# Patient Record
Sex: Male | Born: 1990 | Race: Black or African American | Hispanic: No | Marital: Married | Smoking: Never smoker
Health system: Southern US, Community
[De-identification: ages and names within clinical notes are randomized; demographics above are authoritative.]

---

## 2021-11-08 NOTE — Progress Notes (Addendum)
Client presents for Refugee Communicable Disease Screening.   The consent for treatment was explained to the client.  The plan for the visit was explained to the client.  The communicable disease assessment was explained to the client. Consent signed by client.    Alien Number: [phone] Status: Refugee Class A/B Condition: None  Language Spoke: Kiswahili  Interpreter Name and Fraser Din Number:  Tinnie Gens [id number]   What is your Country of Origin? [place]   Arrived in [place] approx 2003 and lived their for approx 20 years.   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  Yes Any Allergies to any Medications or Foods? No Do you have any Medical Conditions or Concerns today? - To be referred to [facility]. Abdominal pain for several months, since arrival nausea, diarrhea, bloating, decreased appetite Low back pain Pain in inguinal area x2 years Pain in left nostril   Communicable Disease Screening Questions: Do you have any rashes on your body?No      Are you currently experiencing any abdominal pain, vomiting, or diarrhea?  Yes  Have you ever in your lifetime been sexually active?  Yes Males Only:  Do you currently have any new penile discharge, burning or itching?  No  See TB Screening Flow sheet  The following laboratory screening tests offered and accepted by client today:   RPR, HIV,  Hep C, TB screening with QuantiFERON Gold, Hemoglobin Electrophoresis,  Varicella Titer  Did the client decline any of the laboratory tests that were offered? No Hep B testing not needed; previous testing HBsAg 01/04//2023 Negative, will complete Hep B vaccine  series.  Immunization records reviewed and Immunization Screening completed  Where any vaccines needed and/or administered?  Yes; Hep B, Tdap and Flu VIS provided, vaccines administered, copy of NCIR provided, Tolerated well, After vaccine care reviewed.  Reviewed dates for next vaccines.   Will refer to [facility] for general primary care.  CWS case worker to schedule appt.  Ap with [facility] at [facility] 12/14/2021

## 2021-11-10 ENCOUNTER — Ambulatory Visit (LOCAL_COMMUNITY_HEALTH_CENTER): Payer: Medicaid Other

## 2021-11-10 VITALS — BP 113/83 | HR 62 | Ht 65.0 in | Wt 127.5 lb

## 2021-11-10 DIAGNOSIS — Z23 Encounter for immunization: Secondary | ICD-10-CM

## 2021-11-10 DIAGNOSIS — Z111 Encounter for screening for respiratory tuberculosis: Secondary | ICD-10-CM

## 2021-11-10 DIAGNOSIS — Z0184 Encounter for antibody response examination: Secondary | ICD-10-CM

## 2021-11-10 DIAGNOSIS — R7612 Nonspecific reaction to cell mediated immunity measurement of gamma interferon antigen response without active tuberculosis: Secondary | ICD-10-CM | POA: Insufficient documentation

## 2021-11-10 DIAGNOSIS — Z130 Encounter for screening for diseases of the blood and blood-forming organs and certain disorders involving the immune mechanism: Secondary | ICD-10-CM

## 2021-11-10 DIAGNOSIS — Z113 Encounter for screening for infections with a predominantly sexual mode of transmission: Secondary | ICD-10-CM

## 2021-11-10 LAB — HM HIV SCREENING LAB: HM HIV Screening: NEGATIVE

## 2021-11-10 LAB — HM HEPATITIS C SCREENING LAB: HM Hepatitis Screen: NEGATIVE

## 2021-11-14 ENCOUNTER — Other Ambulatory Visit: Payer: Self-pay

## 2021-11-15 LAB — QUANTIFERON-TB GOLD PLUS
QuantiFERON Mitogen Value: >10 IU/mL
QuantiFERON Nil Value: 0.36 IU/mL
QuantiFERON TB1 Ag Value: 9.26 IU/mL
QuantiFERON TB2 Ag Value: >10 IU/mL
QuantiFERON-TB Gold Plus: POSITIVE — AB

## 2021-11-15 LAB — VARICELLA ZOSTER ANTIBODY, IGG: Varicella zoster IgG: 1058 index (ref >165)

## 2021-11-16 ENCOUNTER — Telehealth: Payer: Self-pay

## 2021-11-16 ENCOUNTER — Ambulatory Visit
Admission: RE | Admit: 2021-11-16 | Discharge: 2021-11-16 | Disposition: A | Discharge status: Home / Self Care | Payer: Medicaid Other | Attending: Family Medicine | Admitting: Family Medicine

## 2021-11-16 ENCOUNTER — Other Ambulatory Visit: Payer: Self-pay | Admitting: Surgery

## 2021-11-16 ENCOUNTER — Ambulatory Visit
Admission: RE | Admit: 2021-11-16 | Discharge: 2021-11-16 | Disposition: A | Discharge status: Home / Self Care | Payer: Medicaid Other | Source: Ambulatory Visit | Attending: Family Medicine | Admitting: Family Medicine

## 2021-11-16 ENCOUNTER — Other Ambulatory Visit (LOCAL_COMMUNITY_HEALTH_CENTER): Payer: Medicaid Other

## 2021-11-16 DIAGNOSIS — R7612 Nonspecific reaction to cell mediated immunity measurement of gamma interferon antigen response without active tuberculosis: Secondary | ICD-10-CM | POA: Insufficient documentation

## 2021-11-16 NOTE — Addendum Note (Signed)
Encounter addended by: Vanessa Ralphs on: 11/16/2021 12:33 PM ? Actions taken: Imaging Exam ended

## 2021-11-16 NOTE — Telephone Encounter (Signed)
Spoke with patient via language line [phone]) regarding positive QFT and need for CXR. No symptoms.   ?

## 2021-11-16 NOTE — Addendum Note (Signed)
Encounter addended by: Vanessa Ralphs on: 11/16/2021 12:32 PM ? Actions taken: Imaging Exam begun

## 2021-11-22 NOTE — Addendum Note (Signed)
Addended by: Jennye Moccasin on: 11/22/2021 04:34 PM ? ? Modules accepted: Orders ? ?

## 2021-11-22 NOTE — Progress Notes (Signed)
CXR duplicate, canceling order. ? ?Jennye Moccasin, MD  ?

## 2021-11-23 ENCOUNTER — Ambulatory Visit (LOCAL_COMMUNITY_HEALTH_CENTER): Payer: Medicaid Other | Admitting: Surgery

## 2021-11-23 ENCOUNTER — Other Ambulatory Visit: Payer: Self-pay | Admitting: Surgery

## 2021-11-23 VITALS — Wt 127.0 lb

## 2021-11-23 DIAGNOSIS — R7612 Nonspecific reaction to cell mediated immunity measurement of gamma interferon antigen response without active tuberculosis: Secondary | ICD-10-CM

## 2021-11-23 NOTE — Progress Notes (Signed)
Spoke to patient on phone with [place] interpreter. ? ?EPI updated with missing info. ? ?Explained active versus latent TB, and informed patient of having a negative CXR and no evidence of active TB disease. Explained to the patient that he/she is not sick nor contagious with TB. ? ?Offered treatment for latent TB to ensure patient does not become sick in the future with active TB.  ? ?Explained it would be a 4 month course taking two capsules of an antibiotic, Rifampin, every day.  ? ?Explained the medication, office visits and any necessary imaging or labs would be of no cost to the patient.  ? ?The patient would like to proceed with treatment for latent TB. ? ?Scheduled appointment for Wednesday, May 3rd at 9:15am. ? ?Leigh Aurora, MD  ?

## 2021-11-23 NOTE — Progress Notes (Signed)
+  QFT: 11/10/2021 ?CXR: negative 11/16/2021 ?EPI:11/14/2021 ? ?HIV: neg 11/10/2021 ?Syphilis: neg 11/10/2021 ? ?Tuberculosis treatment orders  ?All patients are to be monitored per [place] and county TB policies.   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 Treat for latent TB per the following: ? ?Rifampin 600mg  daily by mouth x 4 months per Dr. .  ? ?No monthly or baseline labs currently indicated. Only needs labs if concerning symptoms arise or new medications reported that would require monitoring. ? ?Alvester Morin, MD  ?

## 2021-11-25 ENCOUNTER — Telehealth: Payer: Self-pay | Admitting: Surgery

## 2021-11-25 DIAGNOSIS — R7612 Nonspecific reaction to cell mediated immunity measurement of gamma interferon antigen response without active tuberculosis: Secondary | ICD-10-CM

## 2021-11-25 NOTE — Telephone Encounter (Signed)
Swahili interpreter, confirmed that pt spoke to his case worker Jonny Ruiz B, and he will help arrange Benedetto Goad to appt next Weds. Both he and his father are coming to appt. Will remind by text on Tues about appt. ? ?Jennye Moccasin, MD  ?

## 2021-11-30 ENCOUNTER — Ambulatory Visit (LOCAL_COMMUNITY_HEALTH_CENTER): Payer: Medicaid Other | Admitting: Surgery

## 2021-11-30 VITALS — Wt 134.5 lb

## 2021-11-30 DIAGNOSIS — R7612 Nonspecific reaction to cell mediated immunity measurement of gamma interferon antigen response without active tuberculosis: Secondary | ICD-10-CM | POA: Diagnosis not present

## 2021-11-30 MED ORDER — RIFAMPIN 300 MG PO CAPS: 600.0000 mg | ORAL_CAPSULE | Freq: Every day | ORAL | 0 refills | Status: AC

## 2021-11-30 NOTE — Progress Notes (Signed)
Interpreter: Language line, Swahili ? 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 He is here with his father who is also + for latent TB. ? ?Patient wants to start tx for LTBI with Rifampin 600mg  daily for 4 months. Patient has signed agreements to treat and obtain labs if necessary. ? ?The patient does have several constitutional complaints (not eating well, fatigue, stomach pain) and when asked why he is not eating he sounds slightly depressed and reports they are probably having trouble getting food they like and getting around. He misses playing soccer, most soccer clubs are too far away and they do not have transportation. He reports his family often just "sits around the home and feels tired." Encouraged patient that they have been through a huge change in their lives and things will get easier as time goes on.  ? ?Dispensed Rifampin 300mg , #60 at today's visit.  ? ?Potential side effects discussed, patient information sheet given along with contact number. Patient advised to refrain from drinking alcohol during treatment and if sexually active, to use additional birth control (condoms) since rifampin can reduce the efficacy of contraception.  ? ?If any concerning side effects arise patient instructed to stop medication and contact TB staff immediately.  ? ?The patient's next visit is scheduled for 12/28/2021 at 9:00am. ? ?The patient needs a taxi scheduled for transportation for their next visit to and from the health department.  ? ? , MD  ?

## 2021-12-28 ENCOUNTER — Ambulatory Visit (LOCAL_COMMUNITY_HEALTH_CENTER): Payer: Medicaid Other | Admitting: Surgery

## 2021-12-28 ENCOUNTER — Telehealth: Payer: Self-pay | Admitting: Surgery

## 2021-12-28 VITALS — Wt 137.0 lb

## 2021-12-28 DIAGNOSIS — R7612 Nonspecific reaction to cell mediated immunity measurement of gamma interferon antigen response without active tuberculosis: Secondary | ICD-10-CM

## 2021-12-28 MED ORDER — RIFAMPIN 300 MG PO CAPS: 600.0000 mg | ORAL_CAPSULE | Freq: Every day | ORAL | 0 refills | Status: AC

## 2021-12-28 NOTE — Telephone Encounter (Signed)
Spoke to patient about going to [facility] next week with his father to establish care.   Spoke w/Caroline at [facility] [phone], trying for 1pm appt on June 6th for patient to meet with a volunteer and via video interpreter (Swahili) to complete their applications. Also made aware that patients do not have transportation, [facility] to arrange taxi for pts.  Faxed patient info to [facility] at [phone], as far as names, addresses and phone number so they can contact them and confirm their transportation and appts to complete application.   Leigh Aurora, MD

## 2021-12-28 NOTE — Progress Notes (Unsigned)
Swahili__  language; used language line   Patient has been taking Rifampin 600 mg daily for 1 month for LTBI treatment.  Patient is doing well on current medication regimen. No n/v/f/c, eating normally, no concerning weight loss.   Patient does reports some "chest pain" that he later clarifies is chest tightness and a feeling of panic. He brings up the term panic attack on his own, and reports he feels like he has difficulty breathing. He has reported feeling depressed in the past and he has just started a new job at [employer]. He has these feelings sometimes at work, after work, and when taking his medicine. Reassured him that I did not think it was the medicine and that having anxiety and stress would be common for someone in his situation who has had so many huge life changes and is now living in a new country, not knowing the language and having no easy mode of transportation for work or medical needs.   Gave patient information about [facility] which can provide medical and mental health services. Gave patient and his father paper applications and told patient I will call [facility] to see if I can assist in arranging first meeting with staff and an interpreter so he can complete the application and establish care.   Patient reports taking Rifampin daily as prescribed.  Patient has about 6 or 8 pills left. Patient advised to finish the remaining pills in their old bottle, then start on the next bottle of medication.  Dispensed #2 month of Rifampin for LTBI tx. Dispensed #60 300 mg capsules.   I provided counseling today regarding the medication, we discussed the medication, the side effects and when to call clinic. Patient given the opportunity to ask questions.   Patient advised to contact [facility]/TB control phone for any concerning symptoms or questions.  Patient's next visit has been scheduled for:  June 29th at 9:30am.  Contact patient 1 week ahead of time to confirm appointment  and reschedule if necessary.  Will need taxi scheduled for transportation.  Jennye Moccasin, MD

## 2022-01-11 ENCOUNTER — Ambulatory Visit: Payer: Self-pay | Admitting: Gerontology

## 2022-01-26 ENCOUNTER — Ambulatory Visit (LOCAL_COMMUNITY_HEALTH_CENTER): Payer: Medicaid Other | Admitting: Surgery

## 2022-01-26 VITALS — Wt 133.5 lb

## 2022-01-26 DIAGNOSIS — Z719 Counseling, unspecified: Secondary | ICD-10-CM

## 2022-01-26 DIAGNOSIS — R7612 Nonspecific reaction to cell mediated immunity measurement of gamma interferon antigen response without active tuberculosis: Secondary | ICD-10-CM

## 2022-01-26 DIAGNOSIS — Z23 Encounter for immunization: Secondary | ICD-10-CM

## 2022-01-26 MED ORDER — RIFAMPIN 300 MG PO CAPS: 600.0000 mg | ORAL_CAPSULE | Freq: Every day | ORAL | 0 refills | Status: AC

## 2022-01-26 NOTE — Progress Notes (Signed)
Patient seen in Nurse clinic for Tb and Immunization apt.  VIS information sheets given to him for Hep A and Hep B in [employer].  No questions.  Vaccines administered and tolerated well.  After vaccine care reviewed.  Also discussed HPV  vaccine and gave information in Swahili.  Patient will return in Oct for next vaccine series, and will determine at the apt if HPV will be given.  Copy of NCIR provided.  Kalesha Irving Sherrilyn Rist, RN

## 2022-01-26 NOTE — Progress Notes (Signed)
Swahili language; used language line  Patient has been taking Rifampin 600 mg daily for 2 months for LTBI treatment.  Patient is doing well on current medication regimen. No n/v/f/c, eating normally, no concerning weight loss. Patient reports taking medications daily as prescribed as aside from few days missed.  Patient reports that feelings of panic/chest tightness are better, still has feeling of chest tightness but mainly when very hungry, occasionally after taking RIF pills, not so much at work any more. Also reports some muscle/leg pain but appears to be after standing long hours (he mentioned 6 hours) or playing soccer, not likely related to Rifampin. Discussed again establishing care with [facility] for primary care needs, in fact he has been seen to establish PCP but seems to think they will call him later for a future appt. Will clarify with staff and let him know.  Patient not sure how many pills left, forgot a few days but otherwise taking regularly. Patient advised to finish the remaining pills in their old bottle, then start on the next bottle of medication.  Dispensed #3 month of Rifampin for LTBI tx. Dispensed #60, 300 mg capsules.   I provided counseling today regarding the medication, we discussed the medication, the side effects and when to call clinic. Patient given the opportunity to ask questions.   Patient advised to contact [facility]/TB control phone for any concerning symptoms or questions.  Patient's next visit has been scheduled for: Thursday July 27th at 10:100am  Contact patient 1 week ahead of time to confirm appointment and reschedule if necessary.  Will need taxi scheduled for transportation.  Jennye Moccasin, MD

## 2022-02-24 ENCOUNTER — Ambulatory Visit (LOCAL_COMMUNITY_HEALTH_CENTER): Payer: Medicaid Other | Admitting: Surgery

## 2022-02-24 VITALS — Wt 131.0 lb

## 2022-02-24 DIAGNOSIS — R7612 Nonspecific reaction to cell mediated immunity measurement of gamma interferon antigen response without active tuberculosis: Secondary | ICD-10-CM

## 2022-02-24 MED ORDER — RIFAMPIN 300 MG PO CAPS: 600.0000 mg | ORAL_CAPSULE | Freq: Every day | ORAL | 0 refills | Status: AC

## 2022-02-24 NOTE — Progress Notes (Signed)
Language: Swahili; Language line    Patient has been taking Rifampin 600 mg daily for 3 months for LTBI treatment.  Patient is doing well on current medication regimen. No n/v/f/c, eating normally, no concerning weight loss. Patient has occasional neck pain, but reports he has had this since he lived back "at home" in [place]. Patient reports taking medications daily as prescribed, patient has about 8 more days of pills. Patient was advised to complete all of these pills before starting the next bottle.  Dispensed #4 and final month of Rifampin for LTBI tx. Dispensed #60, 300 mg capsules.   Explained to patient that will always be positive on PPD skin test and blood test for exposure to TB. If patient is asked by employer, school, or other institution to take a TB test, patient should supply proof of treatment completion and/or obtain a TB Screening at the health department or at patient's PCP. This information was also provided in writing in the patient's completion letter which was given to the patient today along with a TB treatment completion card.   All completion letters and completion card were sent for scanning into Epic, along with the patient's TB drug record ([id number]).  Patient was advised to contact [facility]/TB control phone for any concerning symptoms or questions.   Jennye Moccasin, MD

## 2022-06-28 ENCOUNTER — Ambulatory Visit: Payer: Medicaid Other

## 2022-07-06 ENCOUNTER — Emergency Department
Admission: EM | Admit: 2022-07-06 | Discharge: 2022-07-06 | Disposition: A | Discharge status: Home / Self Care | Payer: Medicaid Other | Attending: Emergency Medicine | Admitting: Emergency Medicine

## 2022-07-06 ENCOUNTER — Other Ambulatory Visit: Payer: Self-pay

## 2022-07-06 ENCOUNTER — Emergency Department: Payer: Medicaid Other

## 2022-07-06 DIAGNOSIS — K5900 Constipation, unspecified: Secondary | ICD-10-CM | POA: Insufficient documentation

## 2022-07-06 DIAGNOSIS — R109 Unspecified abdominal pain: Secondary | ICD-10-CM | POA: Diagnosis present

## 2022-07-06 LAB — URINALYSIS, ROUTINE W REFLEX MICROSCOPIC
Bilirubin Urine: NEGATIVE
Glucose, UA: NEGATIVE mg/dL
Hgb urine dipstick: NEGATIVE
Ketones, ur: NEGATIVE mg/dL
Leukocytes,Ua: NEGATIVE
Nitrite: NEGATIVE
Protein, ur: NEGATIVE mg/dL
Specific Gravity, Urine: 1.011 (ref 1.005–1.030)
pH: 8 (ref 5.0–8.0)

## 2022-07-06 MED ORDER — IBUPROFEN 600 MG PO TABS: 600.0000 mg | ORAL_TABLET | Freq: Four times a day (QID) | ORAL | 0 refills | Status: DC | PRN

## 2022-07-06 MED ORDER — DOCUSATE SODIUM 100 MG PO CAPS: 100.0000 mg | ORAL_CAPSULE | Freq: Two times a day (BID) | ORAL | 0 refills | Status: AC

## 2022-07-06 NOTE — ED Provider Notes (Signed)
[facility] Provider Note    Event Date/Time   First MD Initiated Contact with Patient 07/06/22 1120     (approximate)   History   Back Pain (RIGHT sided thoracic/lumbar back pain that has been "going on for a long time" and worsens when he stands or walks for an extended period of time; Denies urinary symptoms) and Eye Problem (Bilateral eye "ache", itching, and light sensitivity; Denies any visual changes)   HPI  HPI obtained via video interpreter  Cameron Bradford is a 31 y.o. male with a history of TB who is a recent refugee from [place] and moved to the [place] approximately 8 months ago.  He presents with right-sided abdominal and flank pain which started when he was still in [place] so has been going on for more than 8 months.  It is persistent although intermittent in intensity.  He denies any associated nausea, vomiting, or diarrhea.  He does endorse some constipation and bloating.  He has no urinary symptoms.  He denies any weakness or numbness.  He states that the pain is worse when he is working and using his body and better when he rests.  He states that the symptoms started after he had some snacks at a soccer match back in [place] and wonders if it could be some kind of food poisoning.  He also reports bilateral eye pain which has been intermittent for approximately the same amount of time.  It sometimes radiates towards the back of his head and is not associated with any redness, discharge, or any vision changes.  I reviewed the past medical records.  The patient was evaluated by infectious disease back in April, had a positive QuantiFERON and was treated for TB with rifampin.  At that time she also reported right-sided pain as well as some other GI symptoms.   Physical Exam   Triage Vital Signs: ED Triage Vitals  Enc Vitals Group     BP 07/06/22 1058 125/77     Pulse Rate 07/06/22 1058 75     Resp 07/06/22 1058 16     Temp 07/06/22 1058 98.3 F  (36.8 C)     Temp Source 07/06/22 1058 Oral     SpO2 07/06/22 1058 100 %     Weight --      Height --      Head Circumference --      Peak Flow --      Pain Score 07/06/22 1109 7     Pain Loc --      Pain Edu? --      Excl. in GC? --     Most recent vital signs: Vitals:   07/06/22 1058  BP: 125/77  Pulse: 75  Resp: 16  Temp: 98.3 F (36.8 C)  SpO2: 100%     General: Alert, well-appearing. CV:  Good peripheral perfusion.  Resp:  Normal effort.  Abd:  Soft and nontender.  No distention.  Other:  No CVA tenderness.  No midline or paraspinal back tenderness.  No rashes or lesions.  EOMI.  PERRLA.  Conjunctiva appear normal with no injection or discharge.   ED Results / Procedures / Treatments   Labs (all labs ordered are listed, but only abnormal results are displayed) Labs Reviewed  URINALYSIS, ROUTINE W REFLEX MICROSCOPIC - Abnormal; Notable for the following components:      Result Value   Color, Urine YELLOW (*)    APPearance CLEAR (*)    All  other components within normal limits     EKG     RADIOLOGY  XR abdomen: I independently viewed and interpreted the images; there are no dilated bowel loops or air-fluid levels.  Radiology report indicates nonobstructive pattern with stool in the ascending colon and rectum   PROCEDURES:  Critical Care performed: No  Procedures   MEDICATIONS ORDERED IN ED: Medications - No data to display   IMPRESSION / MDM / [place] / ED COURSE  I reviewed the triage vital signs and the nursing notes.  31 year old male with PMH as noted above presents with subacute to chronic right sided pain with some constipation but no other significant associated symptoms, as well as bilateral eye pain/headaches with no vision changes.  Physical exam is unremarkable.  Differential diagnosis includes, but is not limited to, constipation, IBS, musculoskeletal abdominal wall pain, lumbar radiculopathy, other nerve pain, less  likely kidney stone or other urinary etiology.  We will obtain abdominal plain films, urinalysis, and reassess.  Patient's presentation is most consistent with exacerbation of chronic illness.  ----------------------------------------- 12:55 PM on 07/06/2022 -----------------------------------------  Urinalysis is negative.  X-ray shows a nonobstructive pattern with a moderate amount of stool in the colon especially the ascending colon which corresponds to the location of the patient's pain.  Overall I suspect constipation is likely etiology of the pain.  I have prescribed Colace and ibuprofen.  I instructed him to follow-up with gastroenterology.  The patient is currently applying for Medicaid so should be able to follow-up.  I counseled him on the results of the workup and plan of care via the video interpreter.  I answered all of his questions.  I gave strict return precautions and he expressed understanding.   FINAL CLINICAL IMPRESSION(S) / ED DIAGNOSES   Final diagnoses:  Right flank pain  Constipation, unspecified constipation type     Rx / DC Orders   ED Discharge Orders          Ordered    docusate sodium (COLACE) 100 MG capsule  2 times daily        07/06/22 1254    ibuprofen (ADVIL) 600 MG tablet  Every 6 hours PRN        07/06/22 1254             Note:  This document was prepared using Dragon voice recognition software and may include unintentional dictation errors.    Arta Silence, MD 07/06/22 1256

## 2022-07-06 NOTE — ED Triage Notes (Signed)
RIGHT sided thoracic/lumbar back pain that has been "going on for a long time" and worsens when he stands or walks for an extended period of time; Denies urinary symptoms.  Bilateral eye "ache", itching, and light sensitivity; Denies any visual changes.

## 2022-07-21 ENCOUNTER — Ambulatory Visit (LOCAL_COMMUNITY_HEALTH_CENTER): Payer: Medicaid Other

## 2022-07-21 DIAGNOSIS — Z719 Counseling, unspecified: Secondary | ICD-10-CM

## 2022-07-21 DIAGNOSIS — Z23 Encounter for immunization: Secondary | ICD-10-CM | POA: Diagnosis not present

## 2022-07-21 NOTE — Progress Notes (Signed)
Chief Executive Officer used, Interpreter badge number: [phone] Patient seen for the following immunizations: Tdap, Polio SQ, Flu VIS forms given. NCIR immunization copy given.

## 2022-08-21 ENCOUNTER — Telehealth: Payer: Self-pay

## 2022-08-21 NOTE — Telephone Encounter (Signed)
Client reminded of appt for 08/25/2021 at 9am, call answered and completed.[employer] used, Interpreter badge number: [phone] . Transportation has been set up. Trip ref #: [id number]

## 2022-08-25 ENCOUNTER — Ambulatory Visit (LOCAL_COMMUNITY_HEALTH_CENTER): Payer: Medicaid Other

## 2022-08-25 DIAGNOSIS — Z23 Encounter for immunization: Secondary | ICD-10-CM

## 2022-08-25 NOTE — Progress Notes (Signed)
[place] Language line used, Interpreter agent name and badge number: Beverlee Nims [phone]. Patient seen for the following immunizations: IPV, Hep A VIS forms given. NCIR immunization copy given. Tolerated well.

## 2022-09-27 ENCOUNTER — Ambulatory Visit (LOCAL_COMMUNITY_HEALTH_CENTER): Payer: Medicaid Other

## 2022-09-27 DIAGNOSIS — Z23 Encounter for immunization: Secondary | ICD-10-CM

## 2022-09-27 DIAGNOSIS — Z719 Counseling, unspecified: Secondary | ICD-10-CM

## 2022-09-27 NOTE — Progress Notes (Signed)
[employer] used. Patient seen for the following immunizations:  VIS forms given. NCIR immunization copy given. Comirnaty 23-24'  Are you feeling sick today? No   Have you ever received a dose of COVID-19 Vaccine? [employer], [place], [place], [place], Other) Yes  If yes, which vaccine and how many doses?   Sande Rives 10/14/21, one dose   Did you bring the vaccination record card or other documentation?  No   Do you have a health condition or are undergoing treatment that makes you moderately or severely immunocompromised? This would include, but not be limited to: cancer, HIV, organ transplant, immunosuppressive therapy/high-dose corticosteroids, or moderate/severe primary immunodeficiency.  No  Have you received COVID-19 vaccine before or during hematopoietic cell transplant (HCT) or CAR-T-cell therapies? No  Have you ever had an allergic reaction to: (This would include a severe allergic reaction or a reaction that caused hives, swelling, or respiratory distress, including wheezing.) A component of a COVID-19 vaccine or a previous dose of COVID-19 vaccine? No   Have you ever had an allergic reaction to another vaccine (other thanCOVID-19 vaccine) or an injectable medication? (This would include a severe allergic reaction or a reaction that caused hives, swelling, or respiratory distress, including wheezing.)   No    Do you have a history of any of the following:  Myocarditis or Pericarditis No  Dermal fillers:  No  Multisystem Inflammatory Syndrome (MIS-C or MIS-A)? No  COVID-19 disease within the past 3 months? No  Vaccinated with monkeypox vaccine in the last 4 weeks? No

## 2022-11-13 ENCOUNTER — Ambulatory Visit: Payer: Self-pay

## 2022-11-14 NOTE — Progress Notes (Signed)
Here to get information on the I693 form.

## 2022-12-15 ENCOUNTER — Ambulatory Visit (LOCAL_COMMUNITY_HEALTH_CENTER): Payer: Self-pay

## 2022-12-15 ENCOUNTER — Ambulatory Visit: Payer: Self-pay

## 2022-12-15 DIAGNOSIS — Z719 Counseling, unspecified: Secondary | ICD-10-CM

## 2022-12-15 DIAGNOSIS — Z0289 Encounter for other administrative examinations: Secondary | ICD-10-CM

## 2022-12-15 NOTE — Progress Notes (Signed)
Client here to complete form I-693 form. Copy given to client. Client to follow up with [employer].

## 2023-01-30 IMAGING — CR DG CHEST 1V
1 series · 1 of 1 positions shown · non-contrast
Comparison: No priors.

CLINICAL DATA: 31-year-old male with history of positive
QuantiFERON TB gold test.

EXAM:
CHEST  1 VIEW

[chest pa]
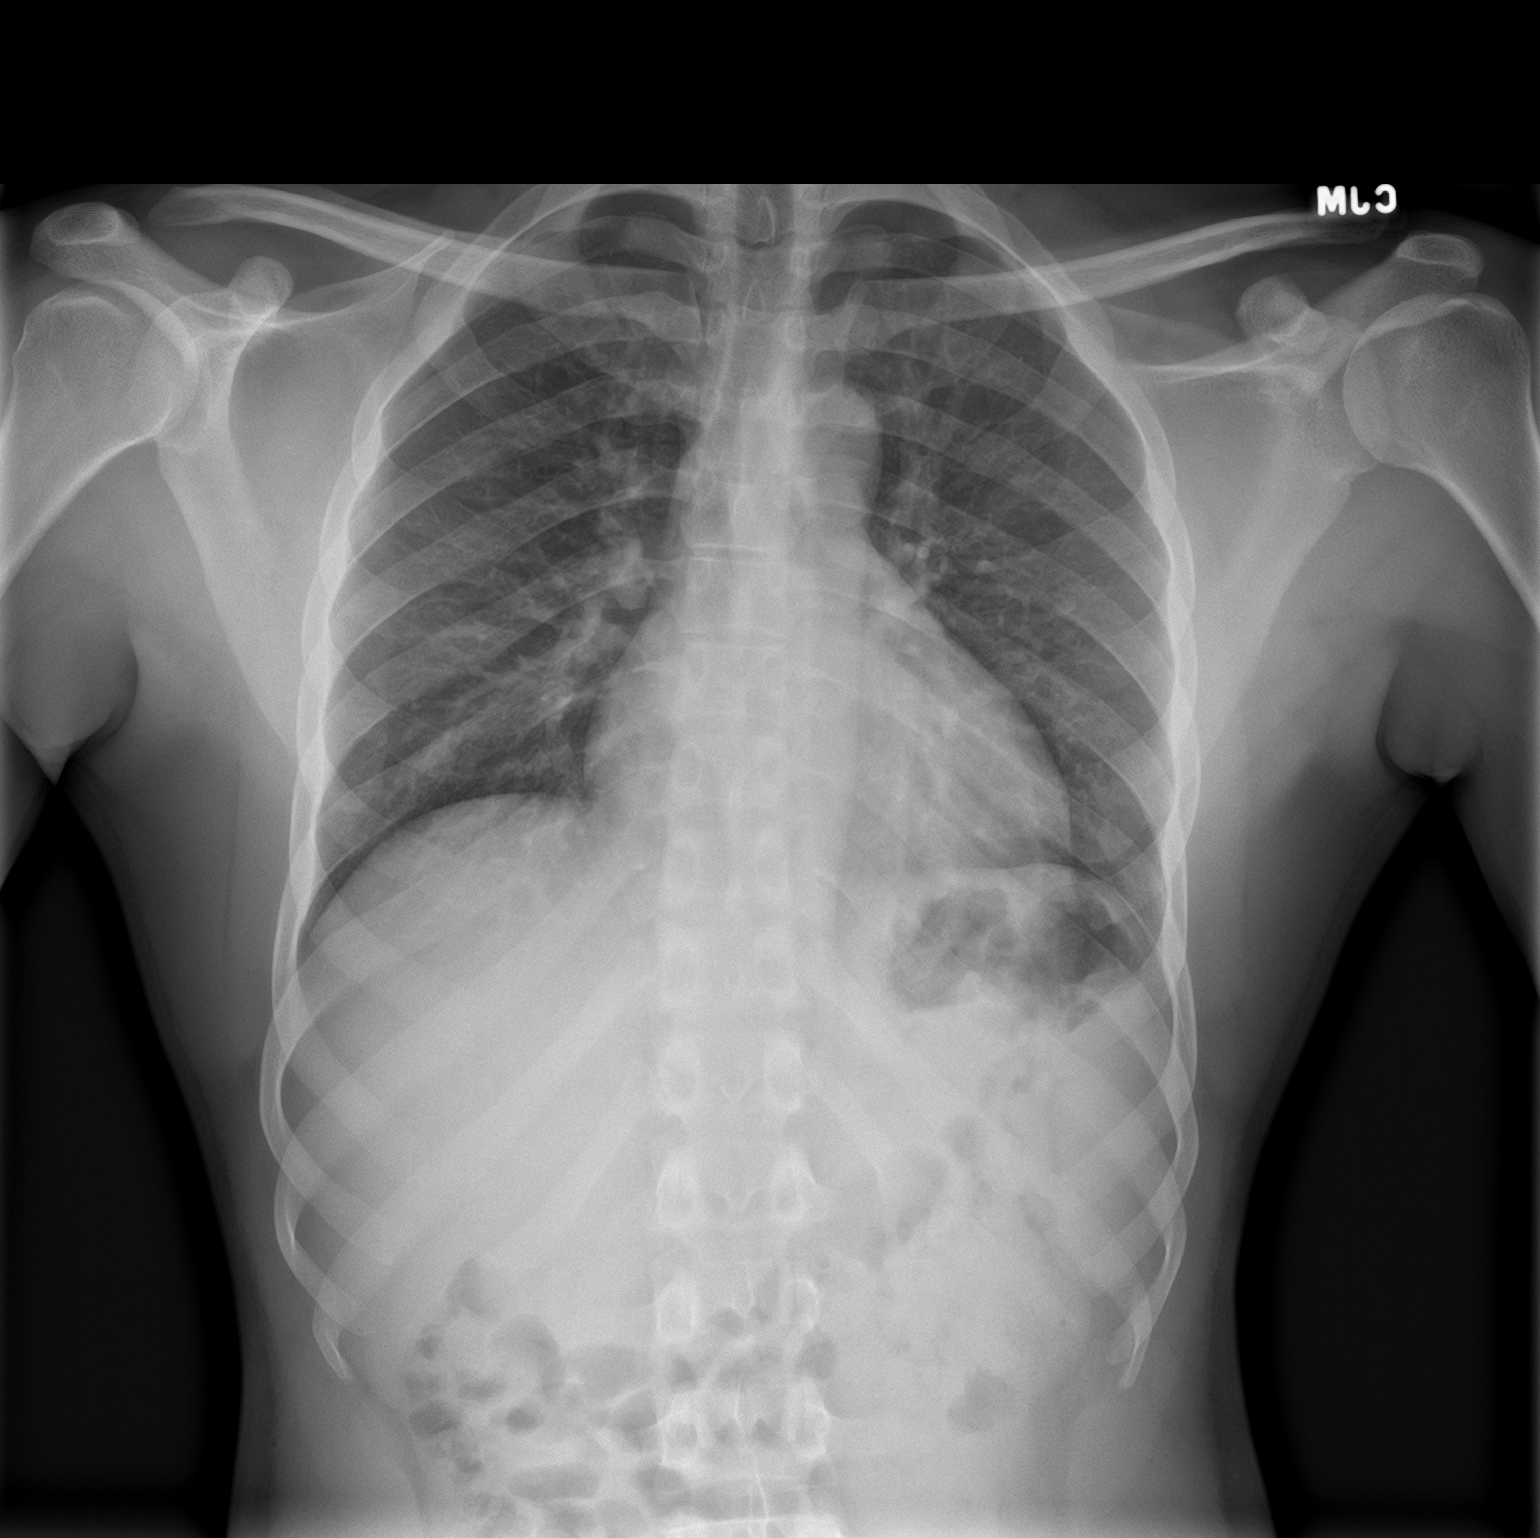

[1 of 1 positions shown; findings below may reference images not displayed]

FINDINGS: Lung volumes are normal. No consolidative airspace disease. No
pleural effusions. No pneumothorax. No pulmonary nodule or mass
noted. Pulmonary vasculature and the cardiomediastinal silhouette
are within normal limits.
IMPRESSION: 1. No radiographic evidence to suggest active intrathoracic

## 2023-05-12 ENCOUNTER — Emergency Department
Admission: EM | Admit: 2023-05-12 | Discharge: 2023-05-12 | Disposition: A | Discharge status: Home / Self Care | Payer: BC Managed Care – PPO | Attending: Emergency Medicine | Admitting: Emergency Medicine

## 2023-05-12 ENCOUNTER — Other Ambulatory Visit: Payer: Self-pay

## 2023-05-12 ENCOUNTER — Emergency Department: Payer: BC Managed Care – PPO

## 2023-05-12 DIAGNOSIS — S199XXA Unspecified injury of neck, initial encounter: Secondary | ICD-10-CM | POA: Diagnosis present

## 2023-05-12 DIAGNOSIS — S161XXA Strain of muscle, fascia and tendon at neck level, initial encounter: Secondary | ICD-10-CM | POA: Insufficient documentation

## 2023-05-12 DIAGNOSIS — X58XXXA Exposure to other specified factors, initial encounter: Secondary | ICD-10-CM | POA: Insufficient documentation

## 2023-05-12 MED ORDER — ETODOLAC 400 MG PO TABS: 400.0000 mg | ORAL_TABLET | Freq: Two times a day (BID) | ORAL | 0 refills | Status: AC

## 2023-05-12 MED ORDER — METHOCARBAMOL 500 MG PO TABS: 500.0000 mg | ORAL_TABLET | Freq: Four times a day (QID) | ORAL | 0 refills | Status: AC | PRN

## 2023-05-12 NOTE — ED Provider Notes (Signed)
[facility] Provider Note    Event Date/Time   First MD Initiated Contact with Patient 05/12/23 1245     (approximate)   History   Neck Pain   HPI  Cameron Bradford is a 32 y.o. male presents to the ED with complaint of cervical pain without history of injury.  Patient states that after working he began having some neck discomfort.  He states that he did see a provider at his workplace who placed him on some medication which has not helped relieve his discomfort completely.  A previous neck complaints.  Pain is worse with movement and with no radiation.     Physical Exam   Triage Vital Signs: ED Triage Vitals [05/12/23 1209]  Encounter Vitals Group     BP (!) 135/97     Systolic BP Percentile      Diastolic BP Percentile      Pulse Rate 67     Resp 17     Temp 98 F (36.7 C)     Temp Source Oral     SpO2 95 %     Weight      Height      Head Circumference      Peak Flow      Pain Score 7     Pain Loc      Pain Education      Exclude from Growth Chart     Most recent vital signs: Vitals:   05/12/23 1209  BP: (!) 135/97  Pulse: 67  Resp: 17  Temp: 98 F (36.7 C)  SpO2: 95%     General: Awake, no distress.  CV:  Good peripheral perfusion.  Resp:  Normal effort.  Abd:  No distention.  Other:  Minimal tenderness on palpation of cervical muscles to the right and into the trapezius muscle.  Range of motion is without restriction.  No point tenderness on palpation of the vertebral spinous processes posteriorly.  Good muscle strength bilaterally and patient is able to move upper extremities without any difficulty or restriction.   ED Results / Procedures / Treatments   Labs (all labs ordered are listed, but only abnormal results are displayed) Labs Reviewed - No data to display   RADIOLOGY Cervical spine x-ray images were reviewed by myself independent of the radiologist and was negative for acute bony abnormality.  Radiology  report reviewed.    PROCEDURES:  Critical Care performed:   Procedures   MEDICATIONS ORDERED IN ED: Medications - No data to display   IMPRESSION / MDM / ASSESSMENT AND PLAN / ED COURSE  I reviewed the triage vital signs and the nursing notes.   Differential diagnosis includes, but is not limited to, cervical strain, musculoskeletal pain, degenerative disc disease, osteoarthritis.  32 year old male presents to the ED with complaint of cervical pain for greater than 5 days.  He denies any injury and states he has taken some over-the-counter medication that was given to him while at work by the company provider.  X-rays were reassuring and patient was made aware.  A prescription for etodolac 400 mg twice daily and methocarbamol every 6 hours as needed for muscle spasms was written and sent to the pharmacy.  Patient is encouraged to use ice or heat to his neck as needed for discomfort.  He is aware that he should not drive or operate machinery while taking the muscle relaxant as it could cause drowsiness and increase his risk for injury.  Patient's presentation is most consistent with acute illness / injury with system symptoms.  FINAL CLINICAL IMPRESSION(S) / ED DIAGNOSES   Final diagnoses:  Acute strain of neck muscle, initial encounter     Rx / DC Orders   ED Discharge Orders          Ordered    methocarbamol (ROBAXIN) 500 MG tablet  Every 6 hours PRN        05/12/23 1512    etodolac (LODINE) 400 MG tablet  2 times daily        05/12/23 1512             Note:  This document was prepared using Dragon voice recognition software and may include unintentional dictation errors.   Tommi Rumps, PA-C 05/12/23 1517    Sharman Cheek, MD 05/13/23 [phone]

## 2023-05-12 NOTE — ED Triage Notes (Signed)
Pt here with right neck pain x4 days. Pt was at work and thinks it may be muscle pain. Pt states it hurts worse when he moves his head.

## 2023-05-12 NOTE — Discharge Instructions (Addendum)
Follow-up with [facility] if any continued problems or concerns.  Use ice or heat to your neck as needed for discomfort.  2 prescriptions were sent to the pharmacy 1 is an anti-inflammatory called etodolac which is twice a day with food.  The other is methocarbamol which is taken every 6 hours as needed for muscle spasms, this should be taken when you are at home.  Do not drive or operate machinery while taking this medication as it could cause drowsiness.
